# Patient Record
Sex: Male | Born: 1980 | Hispanic: Yes | Marital: Single | Smoking: Former smoker
Health system: Southern US, Community
[De-identification: ages and names within clinical notes are randomized; demographics above are authoritative.]

## PROBLEM LIST (undated history)

## (undated) DIAGNOSIS — F1120 Opioid dependence, uncomplicated: Secondary | ICD-10-CM

## (undated) HISTORY — PX: TESTICLE SURGERY: SHX794

---

## 2020-02-12 ENCOUNTER — Encounter (HOSPITAL_BASED_OUTPATIENT_CLINIC_OR_DEPARTMENT_OTHER): Payer: Self-pay | Admitting: Emergency Medicine

## 2020-02-12 ENCOUNTER — Emergency Department (HOSPITAL_BASED_OUTPATIENT_CLINIC_OR_DEPARTMENT_OTHER)
Admission: EM | Admit: 2020-02-12 | Discharge: 2020-02-12 | Disposition: A | Discharge status: Home / Self Care | Payer: Commercial Managed Care - PPO | Attending: Emergency Medicine | Admitting: Emergency Medicine

## 2020-02-12 ENCOUNTER — Other Ambulatory Visit: Payer: Self-pay

## 2020-02-12 ENCOUNTER — Emergency Department (HOSPITAL_BASED_OUTPATIENT_CLINIC_OR_DEPARTMENT_OTHER): Payer: Commercial Managed Care - PPO

## 2020-02-12 DIAGNOSIS — Z87891 Personal history of nicotine dependence: Secondary | ICD-10-CM | POA: Insufficient documentation

## 2020-02-12 DIAGNOSIS — N200 Calculus of kidney: Secondary | ICD-10-CM | POA: Diagnosis not present

## 2020-02-12 DIAGNOSIS — Z79899 Other long term (current) drug therapy: Secondary | ICD-10-CM | POA: Insufficient documentation

## 2020-02-12 DIAGNOSIS — R1031 Right lower quadrant pain: Secondary | ICD-10-CM | POA: Diagnosis present

## 2020-02-12 LAB — URINALYSIS, ROUTINE W REFLEX MICROSCOPIC
Bilirubin Urine: NEGATIVE
Glucose, UA: NEGATIVE mg/dL
Ketones, ur: 15 mg/dL — AB
Leukocytes,Ua: NEGATIVE
Nitrite: NEGATIVE
Protein, ur: NEGATIVE mg/dL
Specific Gravity, Urine: >1.03 — ABNORMAL HIGH (ref 1.005–1.030)
pH: 5.5 (ref 5.0–8.0)

## 2020-02-12 LAB — URINALYSIS, MICROSCOPIC (REFLEX): WBC, UA: NONE SEEN WBC/hpf (ref 0–5)

## 2020-02-12 MED ORDER — IBUPROFEN 600 MG PO TABS: 600.0000 mg | ORAL_TABLET | Freq: Four times a day (QID) | ORAL | 0 refills | Status: AC | PRN

## 2020-02-12 MED ORDER — TAMSULOSIN HCL 0.4 MG PO CAPS: 0.4000 mg | ORAL_CAPSULE | Freq: Every day | ORAL | 0 refills | Status: AC

## 2020-02-12 MED ORDER — KETOROLAC TROMETHAMINE 15 MG/ML IJ SOLN
15.0000 mg | Freq: Once | INTRAMUSCULAR | Status: AC
  Administered 2020-02-12: 15 mg via INTRAVENOUS
  Filled 2020-02-12: qty 1

## 2020-02-12 MED ORDER — ONDANSETRON 4 MG PO TBDP: 4.0000 mg | ORAL_TABLET | Freq: Three times a day (TID) | ORAL | 0 refills | Status: AC | PRN

## 2020-02-12 MED ORDER — TAMSULOSIN HCL 0.4 MG PO CAPS
0.4000 mg | ORAL_CAPSULE | Freq: Once | ORAL | Status: AC
  Administered 2020-02-12: 0.4 mg via ORAL
  Filled 2020-02-12: qty 1

## 2020-02-12 NOTE — ED Provider Notes (Addendum)
[facility] EMERGENCY DEPARTMENT Provider Note  CSN: [id number] Arrival date & time: 02/12/20 2458  Chief Complaint(s) Back Pain  HPI Jake Yang is a 39 y.o. male with a prior history of alcoholism drinking years presents to the emergency department with right-sided flank pain radiating to the right groin.  Pain began 2 hours prior to arrival.  Described as a deep stabbing pain.  No alleviating or factors.  No associated nausea or vomiting.  No diarrhea.  Urinary symptoms.  No recent fevers or infections.  No trauma.  Denies any other physical complaints.  HPI  Past Medical History History reviewed. No pertinent past medical history. There are no problems to display for this patient.  Home Medication(s) Prior to Admission medications   Medication Sig Start Date End Date Taking? Authorizing Provider  buprenorphine-naloxone (SUBOXONE) 2-0.5 mg SUBL SL tablet Place 1 tablet under the tongue daily.   Yes [provider]  ibuprofen (ADVIL) 600 MG tablet Take 1 tablet (600 mg total) by mouth every 6 (six) hours as needed. 02/12/20   Nira Conn, MD  ondansetron (ZOFRAN ODT) 4 MG disintegrating tablet Take 1 tablet (4 mg total) by mouth every 8 (eight) hours as needed for up to 3 days for nausea or vomiting. 02/12/20 02/15/20  Tyshea Imel, Amadeo Garnet, MD  tamsulosin (FLOMAX) 0.4 MG CAPS capsule Take 1 capsule (0.4 mg total) by mouth daily for 10 days. 02/12/20 02/22/20  Nira Conn, MD                                                                                                                                    Past Surgical History History reviewed. No pertinent surgical history. Family History History reviewed. No pertinent family history.  Social History Social History   Tobacco Use  . Smoking status: Former Smoker    Quit date: 12/15/2019    Years since quitting: 0.1  . Smokeless tobacco: Never Used  Substance Use Topics  . Alcohol use: Not  Currently  . Drug use: Never   Allergies Patient has no allergy information on record.  Review of Systems Review of Systems All other systems are reviewed and are negative for acute change except as noted in the HPI  Physical Exam Vital Signs  I have reviewed the triage vital signs BP 120/79 (BP Location: Right Arm)   Pulse 62   Temp 97.6 F (36.4 C) (Oral)   Resp 20   Ht 5\' 9"  (1.753 m)   Wt 83.9 kg   SpO2 100%   BMI 27.32 kg/m   Physical Exam Vitals reviewed.  Constitutional:      General: He is not in acute distress.    Appearance: He is well-developed. He is not diaphoretic.  HENT:     Head: Normocephalic and atraumatic.     Jaw: No trismus.     Right Ear: External ear normal.  Left Ear: External ear normal.     Nose: Nose normal.  Eyes:     General: No scleral icterus.    Conjunctiva/sclera: Conjunctivae normal.  Neck:     Trachea: Phonation normal.  Cardiovascular:     Rate and Rhythm: Normal rate and regular rhythm.  Pulmonary:     Effort: Pulmonary effort is normal. No respiratory distress.     Breath sounds: No stridor.  Abdominal:     General: There is no distension.     Tenderness: There is no abdominal tenderness. There is no right CVA tenderness or left CVA tenderness.  Musculoskeletal:        General: Normal range of motion.     Cervical back: Normal range of motion.  Neurological:     Mental Status: He is alert and oriented to person, place, and time.  Psychiatric:        Behavior: Behavior normal.     ED Results and Treatments Labs (all labs ordered are listed, but only abnormal results are displayed) Labs Reviewed  URINALYSIS, ROUTINE W REFLEX MICROSCOPIC - Abnormal; Notable for the following components:      Result Value   Specific Gravity, Urine >1.030 (*)    Hgb urine dipstick MODERATE (*)    Ketones, ur 15 (*)    All other components within normal limits  URINALYSIS, MICROSCOPIC (REFLEX) - Abnormal; Notable for the following  components:   Bacteria, UA MANY (*)    All other components within normal limits                                                                                                                         EKG  EKG Interpretation  Date/Time:    Ventricular Rate:    PR Interval:    QRS Duration:   QT Interval:    QTC Calculation:   R Axis:     Text Interpretation:        Radiology CT Renal Stone Study  Result Date: 02/12/2020 CLINICAL DATA:  Flank pain.  Kidney stone suspected. EXAM: CT ABDOMEN AND PELVIS WITHOUT CONTRAST TECHNIQUE: Multidetector CT imaging of the abdomen and pelvis was performed following the standard protocol without IV contrast. COMPARISON:  None. FINDINGS: Lower chest: No acute abnormality. Hepatobiliary: No focal liver abnormality is seen. No gallstones, gallbladder wall thickening, or biliary dilatation. Pancreas: Unremarkable. No pancreatic ductal dilatation or surrounding inflammatory changes. Spleen: Normal in size without focal abnormality. Adrenals/Urinary Tract: Adrenal glands are normal. No renal stones are identified. There is mild right-sided hydronephrosis and right ureterectasis. The right ureter is dilated to the level of the with a 3 mm stone at the level of the UVJ. The left ureter is normal. Bladder is unremarkable. Stomach/Bowel: Stomach is within normal limits. Appendix appears normal. No evidence of bowel wall thickening, distention, or inflammatory changes. Vascular/Lymphatic: No significant vascular findings are present. No enlarged abdominal or pelvic lymph nodes. Reproductive: Prostate is unremarkable. Other: No abdominal wall hernia or  abnormality. No abdominopelvic ascites. Musculoskeletal: Degenerative changes in the hips with osteophytes but no significant joint space narrowing. IMPRESSION: 1. There is a 3 mm stone at the right UVJ resulting in mild right hydronephrosis and ureterectasis. No other renal stones are identified. 2. No other acute  abnormalities. Electronically Signed   By: Gerome Sam III M.D   On: 02/12/2020 07:29    Pertinent labs & imaging results that were available during my care of the patient were reviewed by me and considered in my medical decision making (see chart for details).  Medications Ordered in ED Medications  ketorolac (TORADOL) 15 MG/ML injection 15 mg (15 mg Intravenous Given 02/12/20 0624)  tamsulosin (FLOMAX) capsule 0.4 mg (0.4 mg Oral Given 02/12/20 0086)                                                                                                                                    Procedures Procedures  (including critical care time)  Medical Decision Making / ED Course I have reviewed the nursing notes for this encounter and the patient's prior records (if available in EHR or on provided paperwork).   Ajax Schroll was evaluated in Emergency Department on 02/12/2020 for the symptoms described in the history of present illness. He was evaluated in the context of the global COVID-19 pandemic, which necessitated consideration that the patient might be at risk for infection with the SARS-CoV-2 virus that causes COVID-19. Institutional protocols and algorithms that pertain to the evaluation of patients at risk for COVID-19 are in a state of rapid change based on information released by regulatory bodies including the CDC and federal and state organizations. These policies and algorithms were followed during the patient's care in the ED.  Most suspicious for renal colic. No TTP suspicious for other serious intra-abdominal inflammatory/infectious process or SBO.  Pain resolved with toradol. CT stone confirmed ureterostone. UA w/o evidence of infection.        Final Clinical Impression(s) / ED Diagnoses Final diagnoses:  Right renal stone    The patient appears reasonably screened and/or stabilized for discharge and I doubt any other medical condition or other [facility] requiring further  screening, evaluation, or treatment in the ED at this time prior to discharge. Safe for discharge with strict return precautions.  Disposition: Discharge  Condition: Good  I have discussed the results, Dx and Tx plan with the patient/family who expressed understanding and agree(s) with the plan. Discharge instructions discussed at length. The patient/family was given strict return precautions who verbalized understanding of the instructions. No further questions at time of discharge.    ED Discharge Orders         Ordered    tamsulosin (FLOMAX) 0.4 MG CAPS capsule  Daily     02/12/20 0729    ibuprofen (ADVIL) 600 MG tablet  Every 6 hours PRN     02/12/20 0729    ondansetron (ZOFRAN ODT)  4 MG disintegrating tablet  Every 8 hours PRN     02/12/20 4445           Follow Up: Franchot Gallo, MD [place] [address] [phone]  Schedule an appointment as soon as possible for a visit  As needed     This chart was dictated using voice recognition software.  Despite best efforts to proofread,  errors can occur which can change the documentation meaning.      Fatima Blank, MD 02/12/20 [phone]

## 2020-02-12 NOTE — ED Notes (Signed)
Patient aware of need for urine. Unable to provide sample at this time.

## 2020-02-12 NOTE — ED Triage Notes (Signed)
Patient arrived via POV c/o lower right back radiating back to front. Patient in obvious distress. Patient states pain is "too much". Patient is AO x 4, VS WDL, normal gait.

## 2020-04-17 ENCOUNTER — Other Ambulatory Visit: Payer: Self-pay

## 2020-04-17 ENCOUNTER — Encounter (HOSPITAL_BASED_OUTPATIENT_CLINIC_OR_DEPARTMENT_OTHER): Payer: Self-pay | Admitting: Emergency Medicine

## 2020-04-17 ENCOUNTER — Emergency Department (HOSPITAL_BASED_OUTPATIENT_CLINIC_OR_DEPARTMENT_OTHER)
Admission: EM | Admit: 2020-04-17 | Discharge: 2020-04-17 | Disposition: A | Discharge status: Home / Self Care | Payer: Commercial Managed Care - PPO | Attending: Emergency Medicine | Admitting: Emergency Medicine

## 2020-04-17 DIAGNOSIS — Z79899 Other long term (current) drug therapy: Secondary | ICD-10-CM | POA: Diagnosis not present

## 2020-04-17 DIAGNOSIS — Z87891 Personal history of nicotine dependence: Secondary | ICD-10-CM | POA: Insufficient documentation

## 2020-04-17 DIAGNOSIS — Y999 Unspecified external cause status: Secondary | ICD-10-CM | POA: Insufficient documentation

## 2020-04-17 DIAGNOSIS — Y92007 Garden or yard of unspecified non-institutional (private) residence as the place of occurrence of the external cause: Secondary | ICD-10-CM | POA: Diagnosis not present

## 2020-04-17 DIAGNOSIS — S0591XA Unspecified injury of right eye and orbit, initial encounter: Secondary | ICD-10-CM | POA: Diagnosis present

## 2020-04-17 DIAGNOSIS — T1501XA Foreign body in cornea, right eye, initial encounter: Secondary | ICD-10-CM | POA: Insufficient documentation

## 2020-04-17 DIAGNOSIS — X58XXXA Exposure to other specified factors, initial encounter: Secondary | ICD-10-CM | POA: Insufficient documentation

## 2020-04-17 DIAGNOSIS — Y93H2 Activity, gardening and landscaping: Secondary | ICD-10-CM | POA: Insufficient documentation

## 2020-04-17 DIAGNOSIS — S0501XA Injury of conjunctiva and corneal abrasion without foreign body, right eye, initial encounter: Secondary | ICD-10-CM

## 2020-04-17 HISTORY — DX: Opioid dependence, uncomplicated: F11.20

## 2020-04-17 MED ORDER — CIPROFLOXACIN HCL 0.3 % OP OINT: TOPICAL_OINTMENT | Freq: Four times a day (QID) | OPHTHALMIC | 0 refills | Status: AC

## 2020-04-17 MED ORDER — FLUORESCEIN SODIUM 1 MG OP STRP: 1.0000 | ORAL_STRIP | Freq: Once | OPHTHALMIC | Status: DC

## 2020-04-17 MED ORDER — CIPROFLOXACIN HCL 0.3 % OP OINT: TOPICAL_OINTMENT | Freq: Four times a day (QID) | OPHTHALMIC | 0 refills | Status: DC

## 2020-04-17 MED ORDER — FLUORESCEIN SODIUM 1 MG OP STRP
ORAL_STRIP | OPHTHALMIC | Status: AC
  Filled 2020-04-17: qty 1

## 2020-04-17 MED ORDER — TETRACAINE HCL 0.5 % OP SOLN: 2.0000 [drp] | Freq: Once | OPHTHALMIC | Status: DC

## 2020-04-17 MED ORDER — TETRACAINE HCL 0.5 % OP SOLN
OPHTHALMIC | Status: AC
  Filled 2020-04-17: qty 4

## 2020-04-17 NOTE — ED Provider Notes (Signed)
Discussed patient with Dr. Alben Spittle (ophthalmology). He will plan to see patient tomorrow in the office.   Patient needed print out prescription. He was provided with prescription.   Portions of this note were generated with Scientist, clinical (histocompatibility and immunogenetics). Dictation errors may occur despite best attempts at proofreading.     Maxwell Caul, PA-C 04/17/20 1751    Gwyneth Sprout, MD 04/18/20 2045

## 2020-04-17 NOTE — ED Notes (Signed)
ED Provider at bedside for eye exam 

## 2020-04-17 NOTE — ED Triage Notes (Signed)
Right eye irritated for 2 hours. Denies injury.

## 2020-04-17 NOTE — ED Provider Notes (Signed)
[facility] EMERGENCY DEPARTMENT Provider Note   CSN: [id number] Arrival date & time: 04/17/20  1519     History Chief Complaint  Patient presents with  . Eye Pain    Jake Yang is a 39 y.o. male.  HPI   Patient presents to the emergency department with right eye pain that started around noon today. patient states he was leaf blowing his deck and after finishing he had right eye irritation.  Patient has tried to rinse out his eye but states he still has pain.  He describes the pain as a grittiness sensation every time he blinks or moves his eye.  He denies any change in his vision.  Patient does not wear contact lenses or glasses.  Patient does not have any significant medical history, does not take any medication on a daily basis, and denies any allergies.  He denies headache, dizziness, sore throat, nasal congestion, cough, chest pain, shortness of breath, abdominal pain, difficulty urinating or defecating.  Past Medical History:  Diagnosis Date  . Opioid dependence ([place])     There are no problems to display for this patient.   Past Surgical History:  Procedure Laterality Date  . TESTICLE SURGERY         No family history on file.  Social History   Tobacco Use  . Smoking status: Former Smoker    Quit date: 12/15/2019    Years since quitting: 0.3  . Smokeless tobacco: Never Used  Substance Use Topics  . Alcohol use: Not Currently  . Drug use: Never    Home Medications Prior to Admission medications   Medication Sig Start Date End Date Taking? Authorizing Provider  buprenorphine-naloxone (SUBOXONE) 2-0.5 mg SUBL SL tablet Place 1 tablet under the tongue daily.   Yes [provider]  ciprofloxacin (CILOXAN) 0.3 % ophthalmic ointment Place into the right eye 4 (four) times daily for 7 days. 04/17/20 04/24/20  Marcello Fennel, PA-C  ibuprofen (ADVIL) 600 MG tablet Take 1 tablet (600 mg total) by mouth every 6 (six) hours as needed. 02/12/20    Fatima Blank, MD    Allergies    Patient has no known allergies.  Review of Systems   Review of Systems  Constitutional: Negative for chills and fever.  HENT: Negative for congestion and sore throat.   Eyes: Positive for pain and redness. Negative for photophobia and visual disturbance.       Patient has right eye pain which she describes as a gritty sensation when he blinks or moves his eye.  Respiratory: Negative for cough and shortness of breath.   Cardiovascular: Negative for chest pain.  Gastrointestinal: Negative for abdominal pain.  Genitourinary: Negative for enuresis and flank pain.  Musculoskeletal: Negative for back pain.  Skin: Negative for rash.  Neurological: Negative for dizziness and headaches.  Hematological: Does not bruise/bleed easily.    Physical Exam Updated Vital Signs BP 130/90 (BP Location: Right Arm)   Pulse 62   Temp 99.1 F (37.3 C)   Resp 16   Ht 5\' 10"  (1.778 m)   Wt 84 kg   SpO2 99%   BMI 26.57 kg/m   Physical Exam Vitals and nursing note reviewed.  Constitutional:      General: He is not in acute distress.    Appearance: He is not ill-appearing.  HENT:     Head: Normocephalic and atraumatic.     Nose: No congestion.     Mouth/Throat:     Mouth:  Mucous membranes are moist.     Pharynx: Oropharynx is clear.  Eyes:     General: Lids are normal. Lids are everted, no foreign bodies appreciated. Vision grossly intact. Gaze aligned appropriately. No allergic shiner, visual field deficit or scleral icterus.       Right eye: No foreign body, discharge or hordeolum.     Extraocular Movements: Extraocular movements intact.     Right eye: Normal extraocular motion and no nystagmus.     Left eye: Normal extraocular motion and no nystagmus.     Conjunctiva/sclera:     Right eye: Right conjunctiva is injected. No exudate or hemorrhage. Cardiovascular:     Rate and Rhythm: Normal rate and regular rhythm.     Pulses: Normal pulses.      Heart sounds: No murmur. No friction rub. No gallop.   Pulmonary:     Effort: No respiratory distress.     Breath sounds: No wheezing, rhonchi or rales.  Abdominal:     General: There is no distension.     Tenderness: There is no abdominal tenderness. There is no guarding.  Musculoskeletal:        General: No swelling.  Skin:    General: Skin is warm and dry.     Findings: No rash.  Neurological:     Mental Status: He is alert.  Psychiatric:        Mood and Affect: Mood normal.     ED Results / Procedures / Treatments   Labs (all labs ordered are listed, but only abnormal results are displayed) Labs Reviewed - No data to display  EKG None  Radiology No results found.  Procedures .Foreign Body Removal  Date/Time: 04/17/2020 4:51 PM Performed by: Carroll Sage, PA-C Authorized by: Carroll Sage, PA-C  Consent: Verbal consent obtained. Risks and benefits: risks, benefits and alternatives were discussed Consent given by: patient Patient understanding: patient states understanding of the procedure being performed Patient consent: the patient's understanding of the procedure matches consent given Procedure consent: procedure consent matches procedure scheduled Relevant documents: relevant documents present and verified Test results: test results available and properly labeled Site marked: the operative site was marked Imaging studies: imaging studies available Patient identity confirmed: verbally with patient Body area: eye Location details: right cornea  Anesthesia: Local Anesthetic: tetracaine drops Anesthetic total: 2 mL Patient restrained: no Patient cooperative: yes Localization method: eyelid eversion and slit lamp Removal mechanism: moist cotton swab Eye examined with fluorescein. Corneal abrasion size: small Corneal abrasion location: medial and lateral No residual rust ring present. Depth: superficial Complexity: simple 1 objects  recovered. Objects recovered: 1 Post-procedure assessment: foreign body removed Patient tolerance: patient tolerated the procedure well with no immediate complications   (including critical care time)  Medications Ordered in ED Medications  tetracaine (PONTOCAINE) 0.5 % ophthalmic solution 2 drop (has no administration in time range)  fluorescein ophthalmic strip 1 strip (has no administration in time range)  fluorescein 1 MG ophthalmic strip (has no administration in time range)  tetracaine (PONTOCAINE) 0.5 % ophthalmic solution (has no administration in time range)    ED Course  I have reviewed the triage vital signs and the nursing notes.  Pertinent labs & imaging results that were available during my care of the patient were reviewed by me and considered in my medical decision making (see chart for details).    MDM Rules/Calculators/A&P  Visual Acuity  Right Eye Distance: 20/20 Left Eye Distance: 20/15 Bilateral Distance: 20/15(Pt did not use corrective lenses for these exams)  IOP in the right eye was 18 and IOP in left eye was 14.  Evaluated with a Woods lamp. Right eye-showed a small metallic body in the 7 o'clock region of the iris.  Foreign body was removed with a cotton tip.  There were 2 corneal abrasions, one at the 7 o'clock position and one at the 3 o'clock position.  There was no dendritic lesions, Seidel sign or other abnormalities noted.   Patient does not appear to be in any acute distress, is resting comfortably in bed, is not having any difficulty breathing.  This patient does not wear contacts and will be prescribed ciprofloxacin eyedrops and instructed to follow-up with an ophthalmologist (Dr. Alben Spittle 3:15pm tomorrow)  for further evaluation.  Patient was also instructed that he could use over-the-counter ibuprofen for pain relief.  If patient's eye pain increases or he has difficulty with his vision he should come back to emergency  department for further evaluation.  Patient explained the results and plan patient verbalized that he understood them and agrees with above plan. Final Clinical Impression(s) / ED Diagnoses Final diagnoses:  Abrasion of right cornea, initial encounter    Rx / DC Orders ED Discharge Orders         Ordered    ciprofloxacin (CILOXAN) 0.3 % ophthalmic ointment  4 times daily     04/17/20 1720           Carroll Sage, PA-C 04/17/20 1722    Gwyneth Sprout, MD 04/18/20 2045

## 2020-04-17 NOTE — Discharge Instructions (Addendum)
You were treated for a right corneal abrasion.  You are prescribed antibiotics please take as prescribed.  You may also use ibuprofen every 6 hours for pain.  You have been referred to a ophthalmologist Dr. Alben Spittle tomorrow please follow-up with them.  I have also attached community health and wellness review and find a primary care provider.  I want you to come back to emergency department if your eye pain worsens, have blurry vision, increased redness, and/or discharge from the eye must contact emergency department for further evaluation.

## 2020-05-18 ENCOUNTER — Other Ambulatory Visit: Payer: Self-pay | Admitting: Urology

## 2020-05-18 DIAGNOSIS — Q539 Undescended testicle, unspecified: Secondary | ICD-10-CM

## 2020-06-14 ENCOUNTER — Inpatient Hospital Stay: Admission: RE | Admit: 2020-06-14 | Payer: Commercial Managed Care - PPO | Source: Ambulatory Visit

## 2020-06-22 ENCOUNTER — Encounter (HOSPITAL_BASED_OUTPATIENT_CLINIC_OR_DEPARTMENT_OTHER): Payer: Self-pay | Admitting: Emergency Medicine

## 2020-06-22 ENCOUNTER — Other Ambulatory Visit: Payer: Self-pay

## 2020-06-22 ENCOUNTER — Emergency Department (HOSPITAL_BASED_OUTPATIENT_CLINIC_OR_DEPARTMENT_OTHER)
Admission: EM | Admit: 2020-06-22 | Discharge: 2020-06-22 | Disposition: A | Discharge status: Home / Self Care | Payer: Self-pay | Attending: Emergency Medicine | Admitting: Emergency Medicine

## 2020-06-22 DIAGNOSIS — J029 Acute pharyngitis, unspecified: Secondary | ICD-10-CM | POA: Insufficient documentation

## 2020-06-22 DIAGNOSIS — Z87891 Personal history of nicotine dependence: Secondary | ICD-10-CM | POA: Insufficient documentation

## 2020-06-22 LAB — GROUP A STREP BY PCR: Group A Strep by PCR: NOT DETECTED

## 2020-06-22 NOTE — ED Triage Notes (Signed)
Pt states he removed a "white glob" from his R tonsil today. Denies pain.

## 2020-06-22 NOTE — ED Notes (Signed)
ED Provider at bedside. 

## 2020-06-22 NOTE — Discharge Instructions (Addendum)
Your strep test was negative. Recommend using salt water gargles for further care. Follow-up with ENT.

## 2020-06-22 NOTE — ED Provider Notes (Signed)
[facility] EMERGENCY DEPARTMENT Provider Note   CSN: [id number] Arrival date & time: 06/22/20  1823     History Chief Complaint  Patient presents with  . throat problem    Jake Yang is a 39 y.o. male.  Patient states that he was having some throat pain and felt like he pulled a stone out of his tonsil. No fever, no chills. He has never had this happen before.  The history is provided by the patient.  Sore Throat This is a new problem. The problem has not changed since onset.Pertinent negatives include no chest pain, no abdominal pain, no headaches and no shortness of breath. Nothing aggravates the symptoms. Nothing relieves the symptoms. He has tried nothing for the symptoms. The treatment provided no relief.       Past Medical History:  Diagnosis Date  . Opioid dependence ([facility])     There are no problems to display for this patient.   Past Surgical History:  Procedure Laterality Date  . TESTICLE SURGERY         No family history on file.  Social History   Tobacco Use  . Smoking status: Former Smoker    Quit date: 12/15/2019    Years since quitting: 0.5  . Smokeless tobacco: Never Used  Vaping Use  . Vaping Use: Some days  Substance Use Topics  . Alcohol use: Not Currently  . Drug use: Never    Home Medications Prior to Admission medications   Medication Sig Start Date End Date Taking? Authorizing Provider  buprenorphine-naloxone (SUBOXONE) 2-0.5 mg SUBL SL tablet Place 1 tablet under the tongue daily.    [provider]  ibuprofen (ADVIL) 600 MG tablet Take 1 tablet (600 mg total) by mouth every 6 (six) hours as needed. 02/12/20   Nira Conn, MD    Allergies    Patient has no known allergies.  Review of Systems   Review of Systems  Constitutional: Negative for fever.  HENT: Positive for sore throat. Negative for congestion, dental problem, facial swelling, sinus pain, trouble swallowing and voice change.     Respiratory: Negative for shortness of breath.   Cardiovascular: Negative for chest pain.  Gastrointestinal: Negative for abdominal pain.  Neurological: Negative for headaches.    Physical Exam Updated Vital Signs  ED Triage Vitals  Enc Vitals Group     BP 06/22/20 1835 124/65     Pulse Rate 06/22/20 1835 64     Resp 06/22/20 1835 18     Temp 06/22/20 1835 98.9 F (37.2 C)     Temp Source 06/22/20 1835 Oral     SpO2 06/22/20 1835 100 %     Weight 06/22/20 1834 175 lb (79.4 kg)     Height 06/22/20 1834 5\' 9"  (1.753 m)     Head Circumference --      Peak Flow --      Pain Score 06/22/20 1834 0     Pain Loc --      Pain Edu? --      Excl. in GC? --     Physical Exam Constitutional:      General: He is not in acute distress.    Appearance: He is not ill-appearing.  HENT:     Head: Normocephalic and atraumatic.     Nose: Nose normal.     Mouth/Throat:     Mouth: Mucous membranes are moist.     Pharynx: Oropharyngeal exudate present. No posterior oropharyngeal erythema.  Comments: Exudate/stones in bilateral tonsil but no major swelling or erythema Eyes:     Extraocular Movements: Extraocular movements intact.     Pupils: Pupils are equal, round, and reactive to light.  Neurological:     Mental Status: He is alert.     ED Results / Procedures / Treatments   Labs (all labs ordered are listed, but only abnormal results are displayed) Labs Reviewed  GROUP A STREP BY PCR    EKG None  Radiology No results found.  Procedures Procedures (including critical care time)  Medications Ordered in ED Medications - No data to display  ED Course  I have reviewed the triage vital signs and the nursing notes.  Pertinent labs & imaging results that were available during my care of the patient were reviewed by me and considered in my medical decision making (see chart for details).    MDM Rules/Calculators/A&P                          Jake Yang is a  39 year old male who presents to the ED with throat pain. Normal vitals. No fever. Strep test is negative. No concern for abscess or major infectious process in the throat. It looks like he might have some stones in his bilateral tonsils. He felt like he is able to get some out by his hand. Overall not causing any major discomfort at this time. Recommend salt water gargles and removal with his fingers if he is able to if needed. We will have him follow-up with ENT. Discharged in good condition. Understands return precautions.  This chart was dictated using voice recognition software.  Despite best efforts to proofread,  errors can occur which can change the documentation meaning.    Final Clinical Impression(s) / ED Diagnoses Final diagnoses:  Sore throat    Rx / DC Orders ED Discharge Orders    None       Virgina Norfolk, DO 06/22/20 1958

## 2021-06-08 IMAGING — CT CT RENAL STONE PROTOCOL
2 of 4 series · 17 of 46 positions shown, 19 images · non-contrast
Comparison: None.

CLINICAL DATA: Flank pain.  Kidney stone suspected.

EXAM:
CT ABDOMEN AND PELVIS WITHOUT CONTRAST
TECHNIQUE: Multidetector CT imaging of the abdomen and pelvis was performed
following the standard protocol without IV contrast.

[Series 2: axial st · axial · 0.73mm/px · z∈[+852,+1212]mm · 14 of 80 slices shown, 16 images]
[im 4/80  soft-tissue]
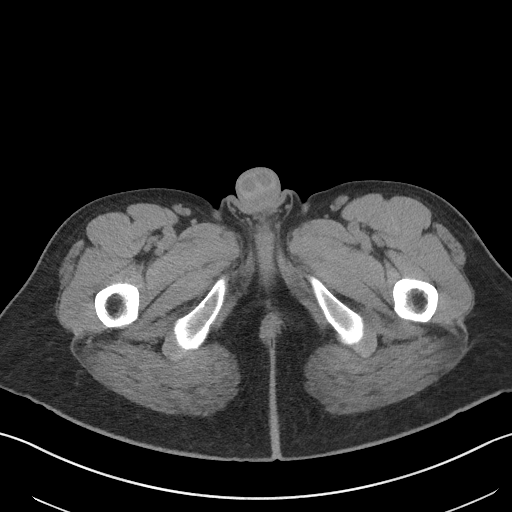
[im 4/80  bone]
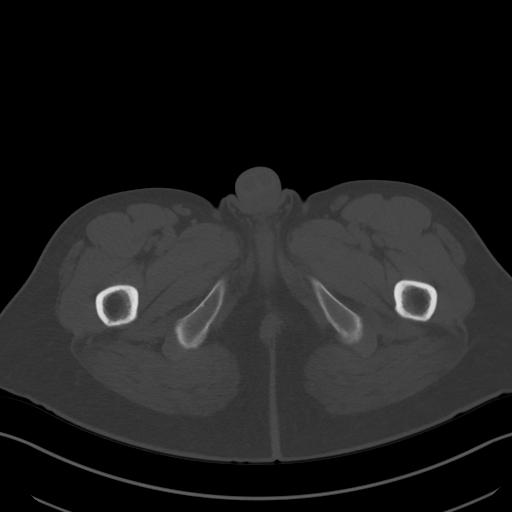
[im 10/80  soft-tissue]
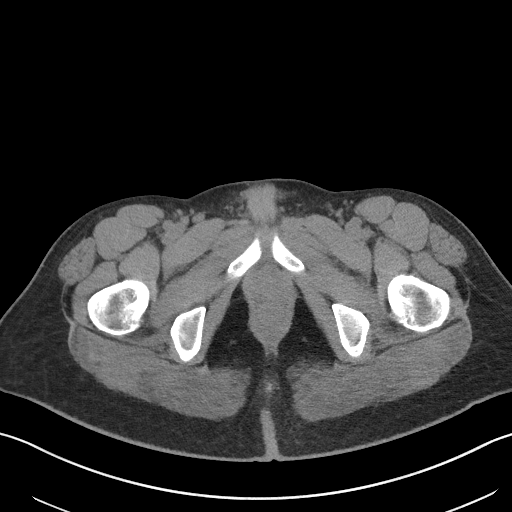
[im 16/80  soft-tissue]
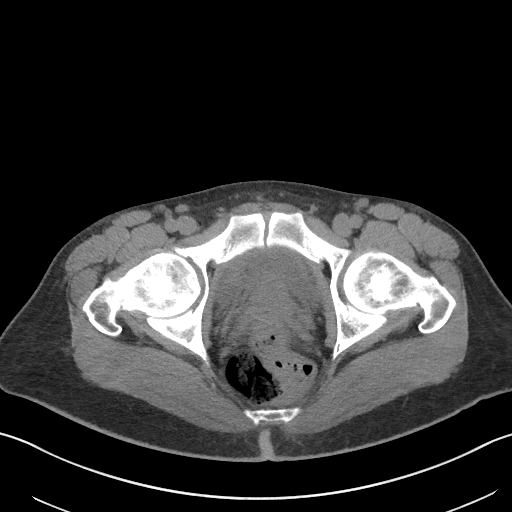
[im 22/80  soft-tissue]
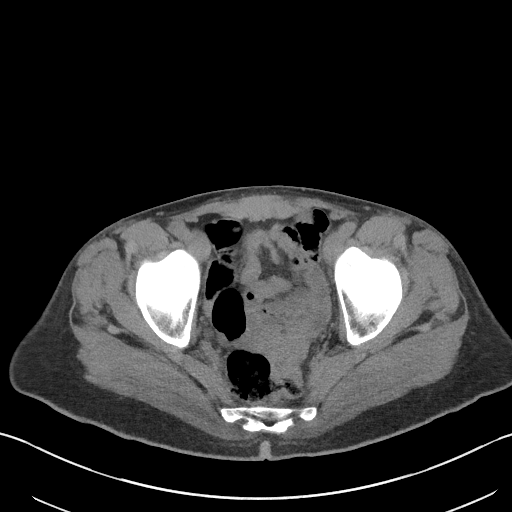
[im 28/80  soft-tissue]
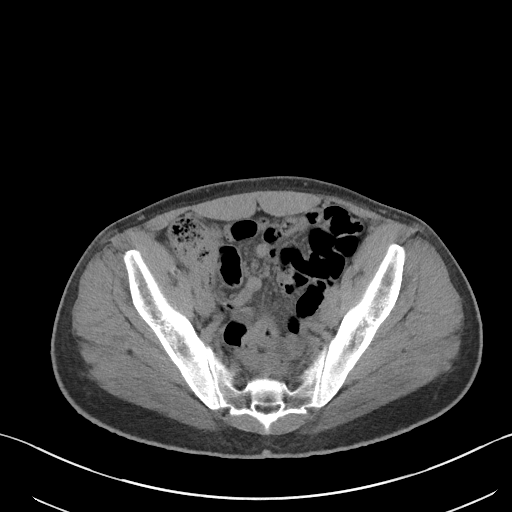
[im 31/80  soft-tissue]
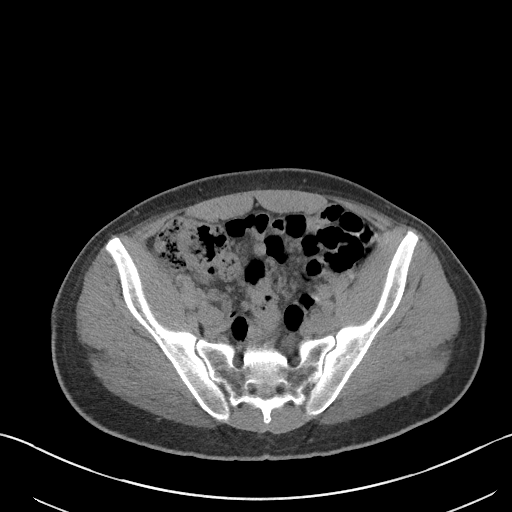
[im 37/80  soft-tissue]
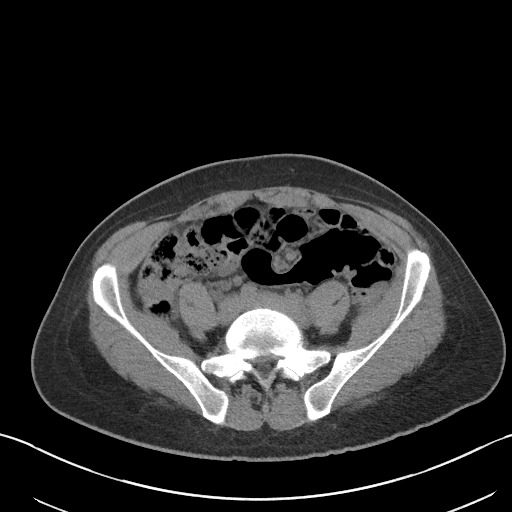
[im 43/80  soft-tissue]
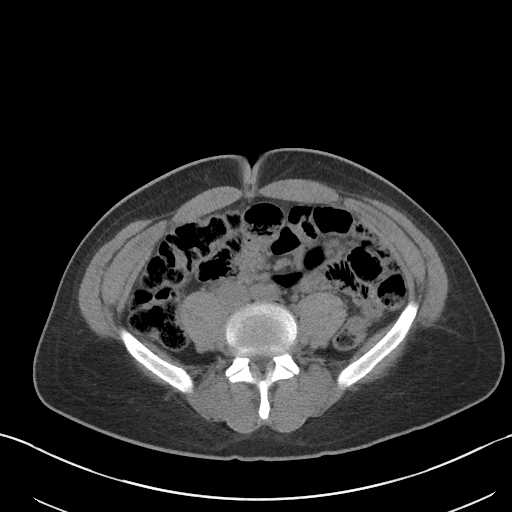
[im 49/80  soft-tissue]
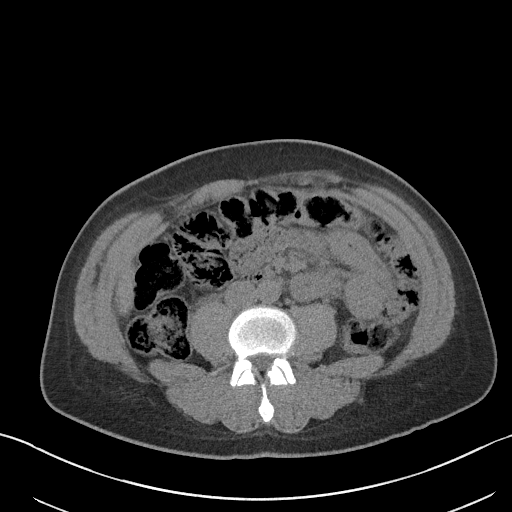
[im 49/80  bone]
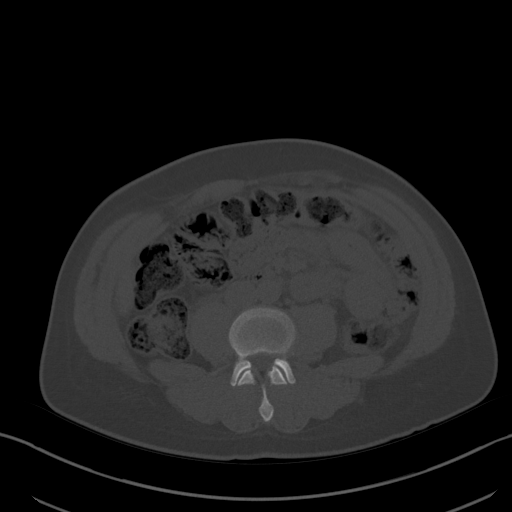
[im 52/80  soft-tissue]
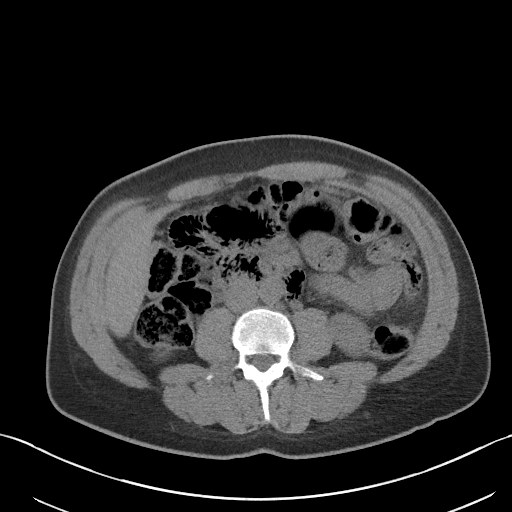
[im 58/80  soft-tissue]
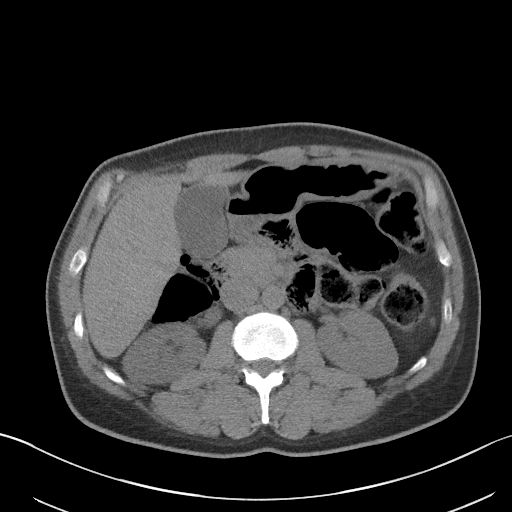
[im 64/80  soft-tissue]
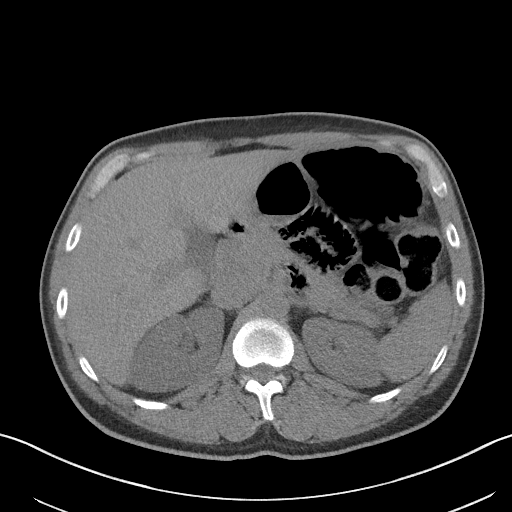
[im 70/80  soft-tissue]
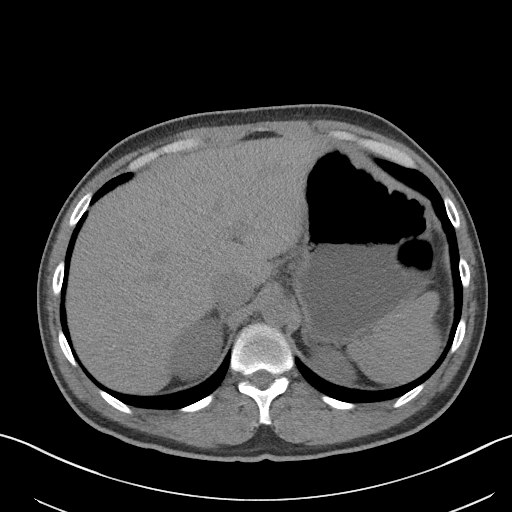
[im 76/80  soft-tissue]
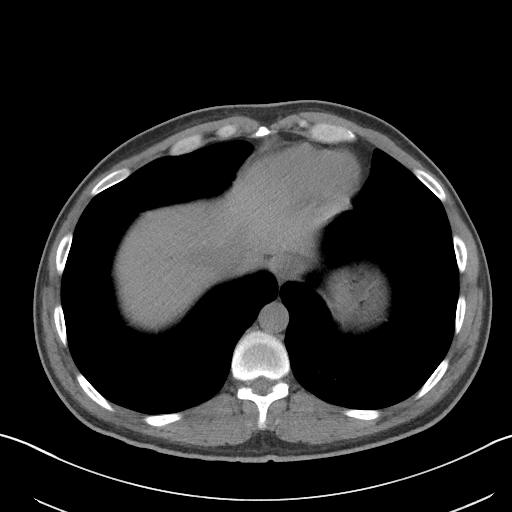

[Series 4: coronal st · coronal · 0.78mm/px · 3 of 83 slices shown]
[im 28/83  soft-tissue]
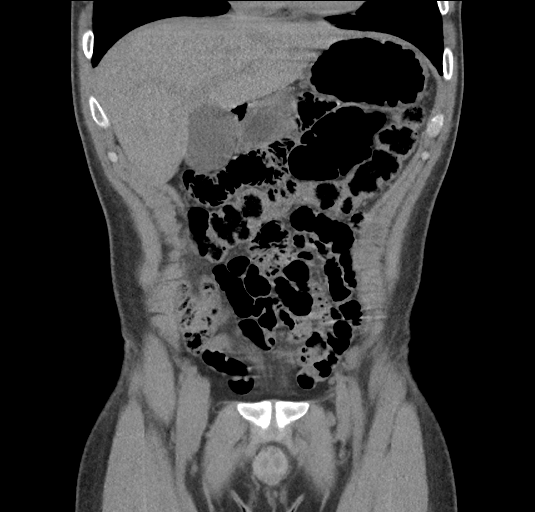
[im 37/83  soft-tissue]
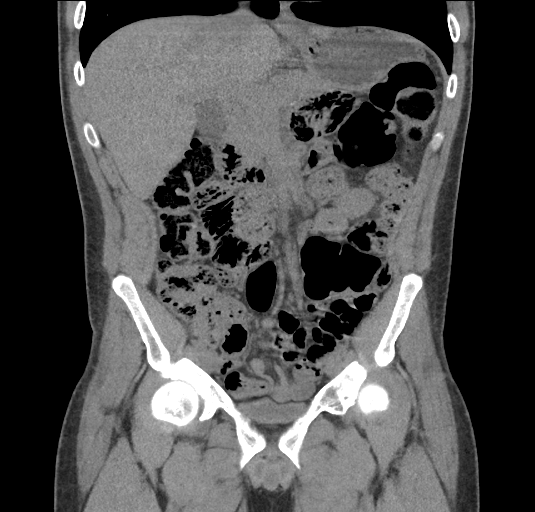
[im 46/83  soft-tissue]
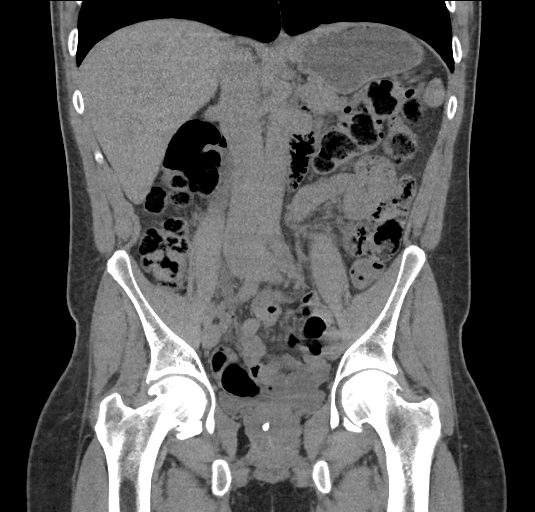

[17 of 46 positions shown; findings below may reference images not displayed]

FINDINGS: Lower chest: No acute abnormality.

Hepatobiliary: No focal liver abnormality is seen. No gallstones,
gallbladder wall thickening, or biliary dilatation.

Pancreas: Unremarkable. No pancreatic ductal dilatation or
surrounding inflammatory changes.

Spleen: Normal in size without focal abnormality.

Adrenals/Urinary Tract: Adrenal glands are normal. No renal stones
are identified. There is mild right-sided hydronephrosis and right
ureterectasis. The right ureter is dilated to the level of the with
a 3 mm stone at the level of the UVJ. The left ureter is normal.
Bladder is unremarkable.

Stomach/Bowel: Stomach is within normal limits. Appendix appears
normal. No evidence of bowel wall thickening, distention, or
inflammatory changes.

Vascular/Lymphatic: No significant vascular findings are present. No
enlarged abdominal or pelvic lymph nodes.

Reproductive: Prostate is unremarkable.

Other: No abdominal wall hernia or abnormality. No abdominopelvic
ascites.

Musculoskeletal: Degenerative changes in the hips with osteophytes
but no significant joint space narrowing.
IMPRESSION: 1. There is a 3 mm stone at the right UVJ resulting in mild right
hydronephrosis and ureterectasis. No other renal stones are
identified.
2. No other acute abnormalities.
